# Patient Record
Sex: Male | Born: 1970 | Race: White | Hispanic: No | Marital: Married | Smoking: Never smoker
Health system: Southern US, Community
[De-identification: ages and names within clinical notes are randomized; demographics above are authoritative.]

## PROBLEM LIST (undated history)

## (undated) DIAGNOSIS — E119 Type 2 diabetes mellitus without complications: Secondary | ICD-10-CM

## (undated) DIAGNOSIS — E789 Disorder of lipoprotein metabolism, unspecified: Secondary | ICD-10-CM

---

## 2009-10-24 ENCOUNTER — Emergency Department (HOSPITAL_COMMUNITY): Admission: EM | Admit: 2009-10-24 | Discharge: 2009-10-24 | Payer: Self-pay | Admitting: Emergency Medicine

## 2009-10-29 ENCOUNTER — Emergency Department (HOSPITAL_COMMUNITY): Admission: EM | Admit: 2009-10-29 | Discharge: 2009-10-29 | Payer: Self-pay | Admitting: Emergency Medicine

## 2009-11-03 ENCOUNTER — Ambulatory Visit (HOSPITAL_COMMUNITY): Admission: RE | Admit: 2009-11-03 | Discharge: 2009-11-03 | Payer: Self-pay | Admitting: Urology

## 2010-07-09 LAB — CBC
HCT: 44.8 % (ref 39.0–52.0)
MCH: 30.7 pg (ref 26.0–34.0)
MCHC: 33.7 g/dL (ref 30.0–36.0)
RBC: 4.93 MIL/uL (ref 4.22–5.81)
RDW: 13 % (ref 11.5–15.5)
WBC: 8.8 10*3/uL (ref 4.0–10.5)

## 2010-07-09 LAB — POCT I-STAT, CHEM 8
BUN: 8 mg/dL (ref 6–23)
Calcium, Ion: 1.11 mmol/L — ABNORMAL LOW (ref 1.12–1.32)
Calcium, Ion: 1.19 mmol/L (ref 1.12–1.32)
Chloride: 106 mEq/L (ref 96–112)
Glucose, Bld: 105 mg/dL — ABNORMAL HIGH (ref 70–99)
Potassium: 3.4 mEq/L — ABNORMAL LOW (ref 3.5–5.1)
TCO2: 31 mmol/L (ref 0–100)

## 2010-07-09 LAB — URINALYSIS, ROUTINE W REFLEX MICROSCOPIC
Bilirubin Urine: NEGATIVE
Bilirubin Urine: NEGATIVE
Glucose, UA: NEGATIVE mg/dL
Glucose, UA: NEGATIVE mg/dL
Ketones, ur: 15 mg/dL — AB
Protein, ur: 30 mg/dL — AB
Specific Gravity, Urine: 1.022 (ref 1.005–1.030)
Specific Gravity, Urine: 1.025 (ref 1.005–1.030)
pH: 5.5 (ref 5.0–8.0)

## 2010-07-09 LAB — GLUCOSE, CAPILLARY
Glucose-Capillary: 110 mg/dL — ABNORMAL HIGH (ref 70–99)
Glucose-Capillary: 180 mg/dL — ABNORMAL HIGH (ref 70–99)

## 2010-07-09 LAB — URINE MICROSCOPIC-ADD ON

## 2012-04-14 ENCOUNTER — Other Ambulatory Visit: Payer: Self-pay | Admitting: Family Medicine

## 2012-04-14 DIAGNOSIS — M545 Low back pain, unspecified: Secondary | ICD-10-CM

## 2012-04-17 ENCOUNTER — Ambulatory Visit
Admission: RE | Admit: 2012-04-17 | Discharge: 2012-04-17 | Disposition: A | Discharge status: Home / Self Care | Payer: BC Managed Care – PPO | Source: Ambulatory Visit | Attending: Family Medicine | Admitting: Family Medicine

## 2012-04-17 ENCOUNTER — Ambulatory Visit: Admission: RE | Admit: 2012-04-17 | Payer: BC Managed Care – PPO | Source: Ambulatory Visit

## 2012-04-17 ENCOUNTER — Other Ambulatory Visit: Payer: Self-pay | Admitting: Family Medicine

## 2012-04-17 ENCOUNTER — Other Ambulatory Visit: Payer: Self-pay

## 2012-04-17 DIAGNOSIS — M545 Low back pain, unspecified: Secondary | ICD-10-CM

## 2012-04-17 MED ORDER — IOHEXOL 300 MG/ML  SOLN
100.0000 mL | Freq: Once | INTRAMUSCULAR | Status: AC | PRN
  Administered 2012-04-17: 100 mL via INTRAVENOUS

## 2013-03-18 IMAGING — CT CT ABD-PELV W/ CM
2 of 5 series · 16 of 46 positions shown, 18 images · IV contrast ([ID] OMNI 300)
Comparison: CT abdomen pelvis of 10/24/2009

CLINICAL DATA: Low back pain for several weeks, evaluate for
possible kidney stones

CT ABDOMEN AND PELVIS WITH CONTRAST
TECHNIQUE: Multidetector CT imaging of the abdomen and pelvis was
performed following the standard protocol during bolus
administration of intravenous contrast.
Contrast: 100mL OMNIPAQUE IOHEXOL 300 MG/ML  SOLN

[Series 2: abd/pelvis with · axial · 0.69mm/px · z∈[-388,+37]mm · 13 of 95 slices shown, 15 images]
[im 5/95  soft-tissue]
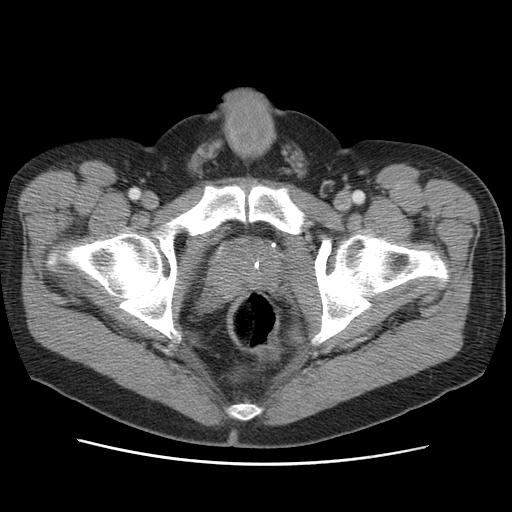
[im 5/95  bone]
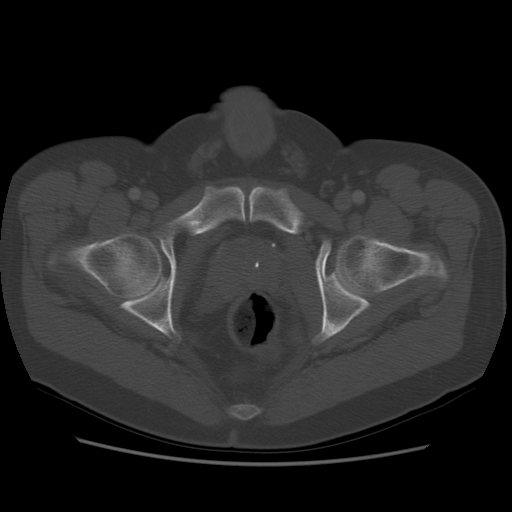
[im 15/95  soft-tissue]
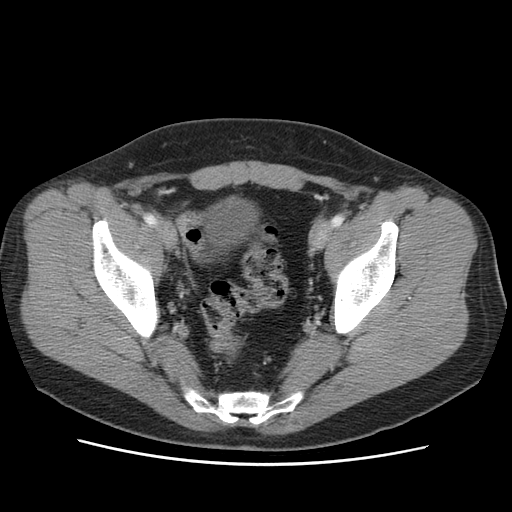
[im 20/95  soft-tissue]
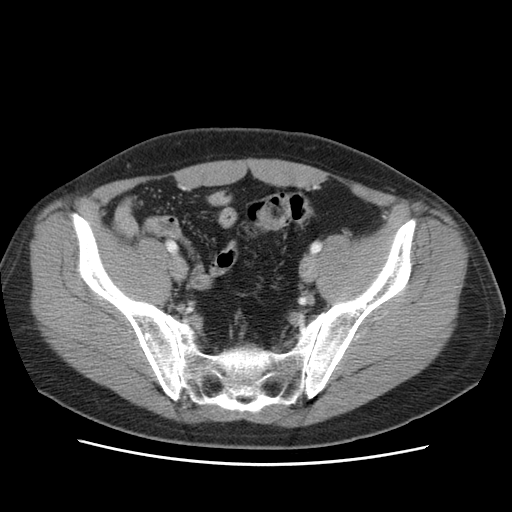
[im 25/95  soft-tissue]
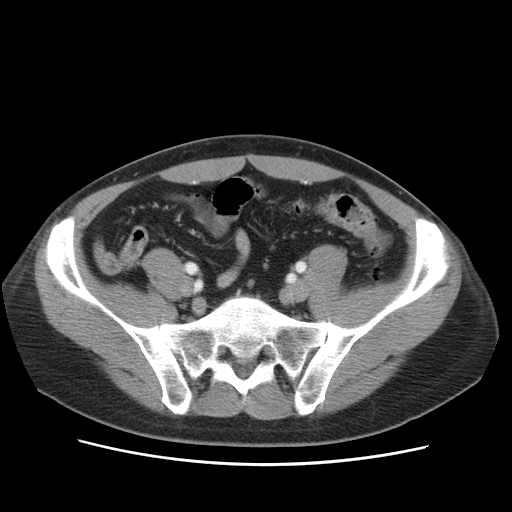
[im 35/95  soft-tissue]
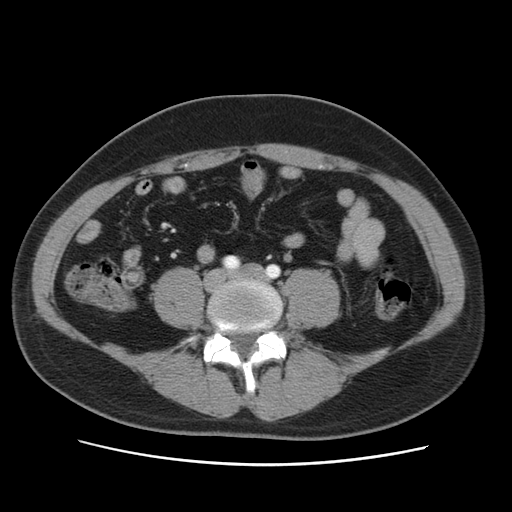
[im 40/95  soft-tissue]
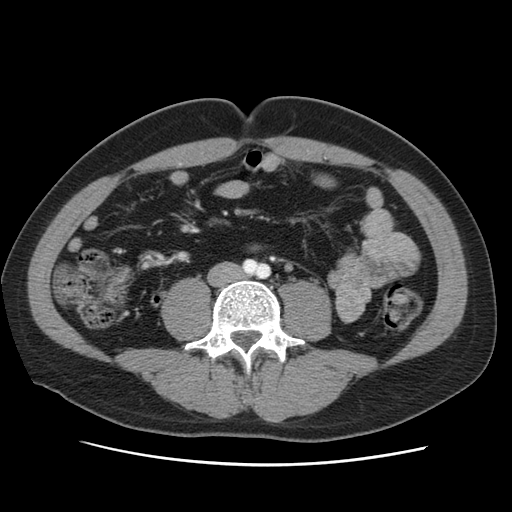
[im 50/95  soft-tissue]
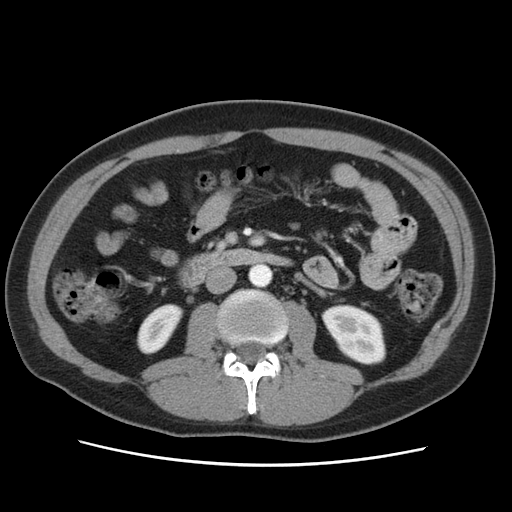
[im 55/95  soft-tissue]
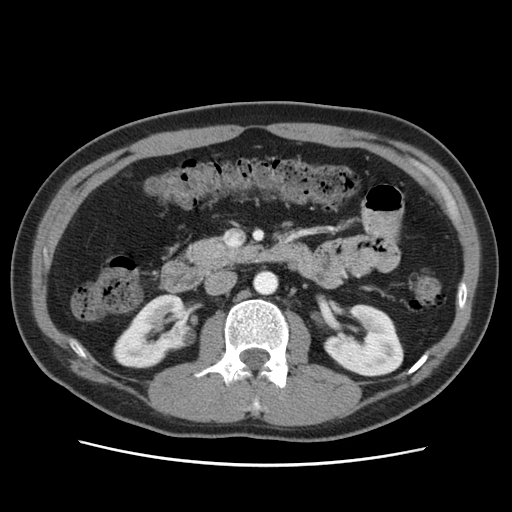
[im 60/95  soft-tissue]
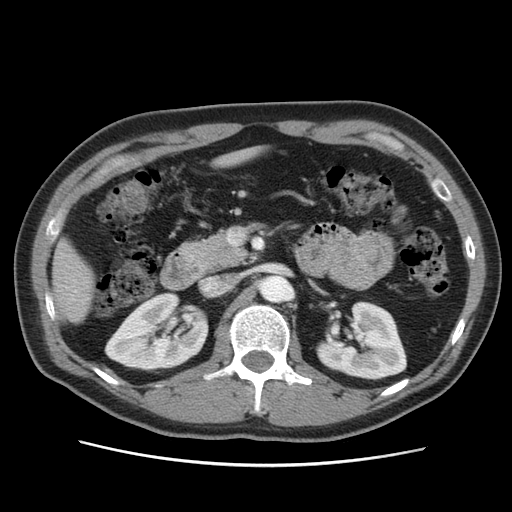
[im 60/95  bone]
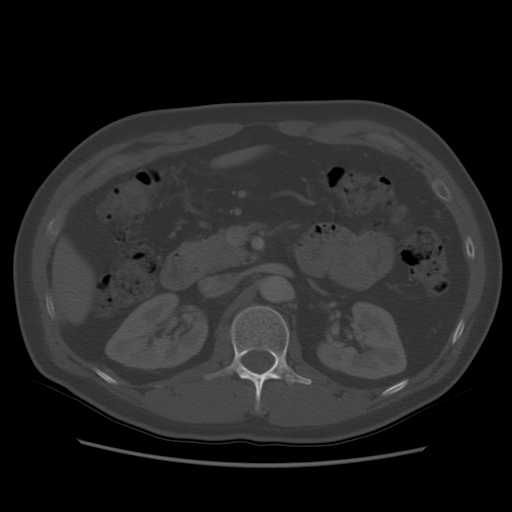
[im 70/95  soft-tissue]
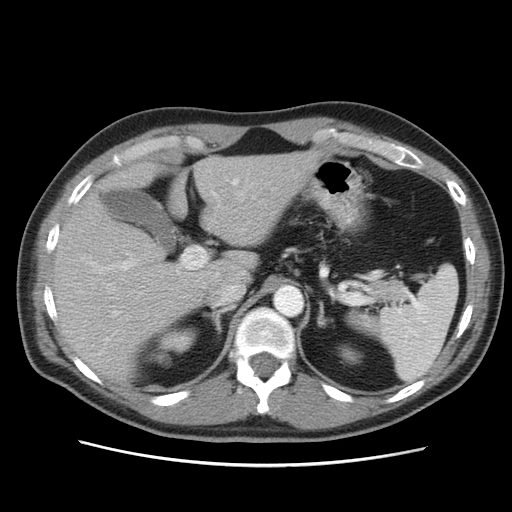
[im 75/95  soft-tissue]
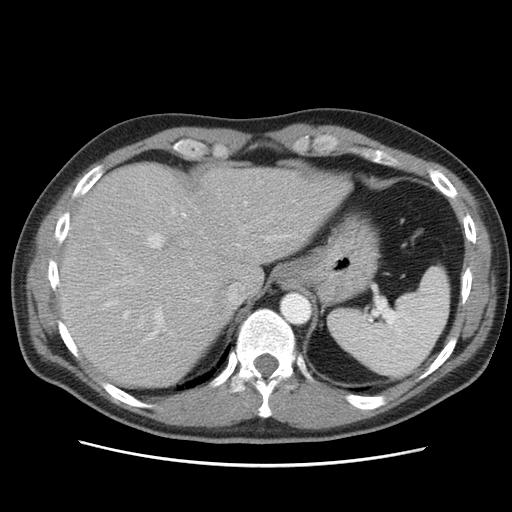
[im 80/95  soft-tissue]
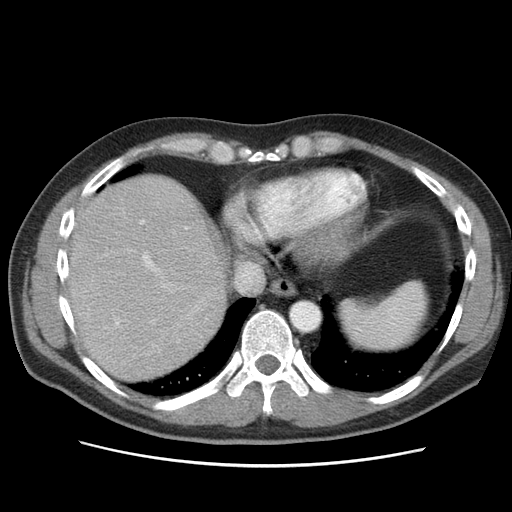
[im 90/95  soft-tissue]
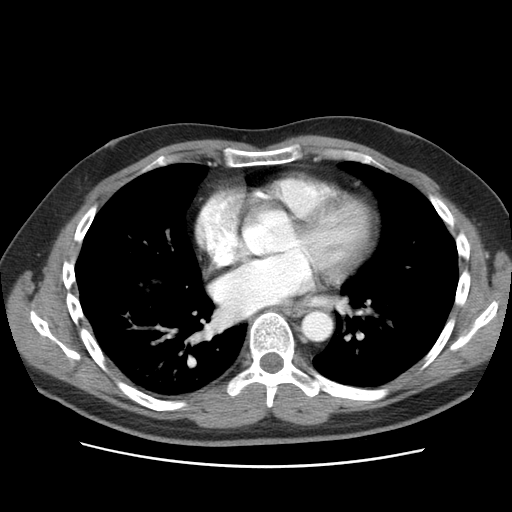

[Series 400: coronal · coronal · 1.02mm/px · 3 of 105 slices shown]
[im 35/105  soft-tissue]
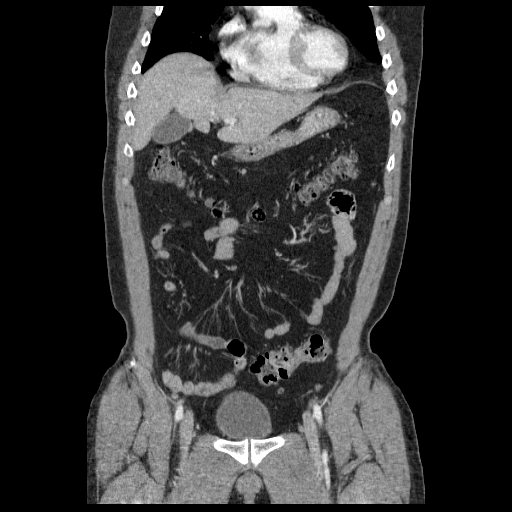
[im 47/105  soft-tissue]
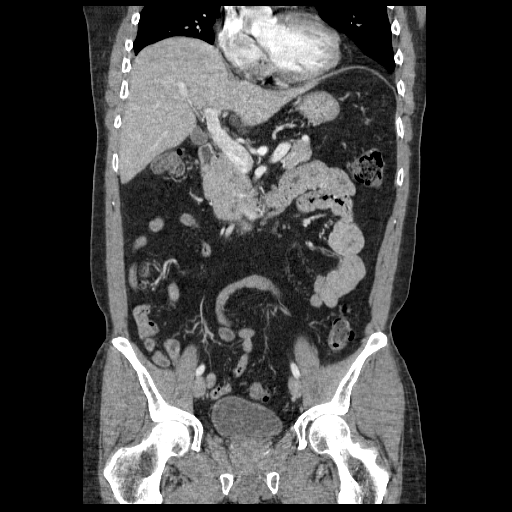
[im 58/105  soft-tissue]
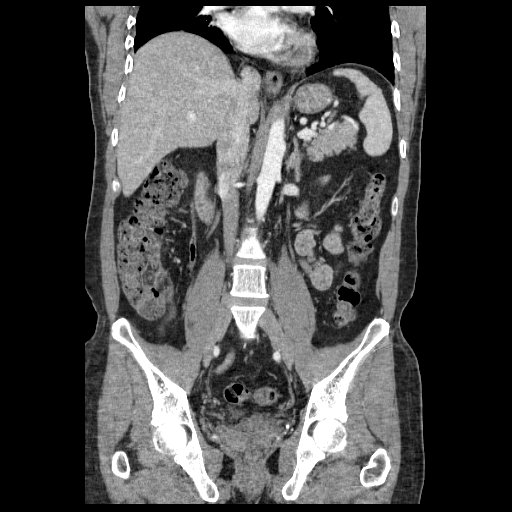

[16 of 46 positions shown; findings below may reference images not displayed]

BUN and creatinine were obtained on site at [HOSPITAL] at
[HOSPITAL]..
Results:  BUN 7 mg/dL,  Creatinine  1.0 mg/dL.
FINDINGS: The lung bases are clear.  There is mild cardiomegaly
present.  The liver enhances with no focal abnormality and no
ductal dilatation is seen.  No calcified gallstones are noted.  The
gallbladder wall may be slightly irregular.  The pancreas is normal
in size and the pancreatic duct is not dilated.  The adrenal glands
and spleen are unremarkable.  The stomach is decompressed and
cannot be evaluated.  The kidneys enhance with no calculus or mass,
with only a tiny 11 mm cyst medially in the right lower kidney. On
delayed images the pelvocaliceal systems are unremarkable.  The
ureters are normal in caliber.  The abdominal aorta appears normal
and no adenopathy is seen.

The distal ureters are normal in caliber and no distal ureteral
calculus is seen.  The urinary bladder is not optimally distended
but no abnormality is noted.  The seminal vesicles are somewhat
prominent, and the prostate is normal in size.  No fluid is seen
within the pelvis.  There are a few scattered rectosigmoid colonic
diverticula noted.  Also there are scattered diverticula throughout
the remainder of the more proximal colon.  The terminal ileum and
the appendix are unremarkable.  No abdominal wall hernia is seen.
No groin adenopathy is seen. No bony abnormality is seen.
IMPRESSION: 1.  The seminal vesicles have enlarged since the prior CT of October 2009 and appear more lobular and low in attenuation, a finding of
questionable significance.  The prostate is not enlarged although
it does contain a few prostatic calculi.
2.  No renal or ureteral calculi.  No hydronephrosis.
3.  Scattered colonic diverticula.

## 2016-11-07 DIAGNOSIS — Z79899 Other long term (current) drug therapy: Secondary | ICD-10-CM | POA: Diagnosis not present

## 2016-11-07 DIAGNOSIS — E119 Type 2 diabetes mellitus without complications: Secondary | ICD-10-CM | POA: Diagnosis not present

## 2016-11-07 DIAGNOSIS — E785 Hyperlipidemia, unspecified: Secondary | ICD-10-CM | POA: Diagnosis not present

## 2017-04-30 DIAGNOSIS — E119 Type 2 diabetes mellitus without complications: Secondary | ICD-10-CM | POA: Diagnosis not present

## 2017-04-30 DIAGNOSIS — I10 Essential (primary) hypertension: Secondary | ICD-10-CM | POA: Diagnosis not present

## 2017-04-30 DIAGNOSIS — M25522 Pain in left elbow: Secondary | ICD-10-CM | POA: Diagnosis not present

## 2017-04-30 DIAGNOSIS — E785 Hyperlipidemia, unspecified: Secondary | ICD-10-CM | POA: Diagnosis not present

## 2017-11-13 DIAGNOSIS — E785 Hyperlipidemia, unspecified: Secondary | ICD-10-CM | POA: Diagnosis not present

## 2017-11-13 DIAGNOSIS — E1165 Type 2 diabetes mellitus with hyperglycemia: Secondary | ICD-10-CM | POA: Diagnosis not present

## 2017-11-13 DIAGNOSIS — I10 Essential (primary) hypertension: Secondary | ICD-10-CM | POA: Diagnosis not present

## 2018-01-15 DIAGNOSIS — E1165 Type 2 diabetes mellitus with hyperglycemia: Secondary | ICD-10-CM | POA: Diagnosis not present

## 2018-01-15 DIAGNOSIS — I10 Essential (primary) hypertension: Secondary | ICD-10-CM | POA: Diagnosis not present

## 2018-01-15 DIAGNOSIS — E785 Hyperlipidemia, unspecified: Secondary | ICD-10-CM | POA: Diagnosis not present

## 2018-01-15 DIAGNOSIS — R05 Cough: Secondary | ICD-10-CM | POA: Diagnosis not present

## 2018-02-19 DIAGNOSIS — R040 Epistaxis: Secondary | ICD-10-CM | POA: Diagnosis not present

## 2018-03-07 DIAGNOSIS — J343 Hypertrophy of nasal turbinates: Secondary | ICD-10-CM | POA: Diagnosis not present

## 2018-03-07 DIAGNOSIS — R040 Epistaxis: Secondary | ICD-10-CM | POA: Diagnosis not present

## 2018-03-24 DIAGNOSIS — R040 Epistaxis: Secondary | ICD-10-CM | POA: Diagnosis not present

## 2021-11-08 ENCOUNTER — Telehealth (HOSPITAL_BASED_OUTPATIENT_CLINIC_OR_DEPARTMENT_OTHER): Payer: Self-pay

## 2021-11-08 ENCOUNTER — Other Ambulatory Visit (HOSPITAL_BASED_OUTPATIENT_CLINIC_OR_DEPARTMENT_OTHER): Payer: Self-pay | Admitting: Family Medicine

## 2021-11-08 DIAGNOSIS — R0789 Other chest pain: Secondary | ICD-10-CM

## 2021-11-09 ENCOUNTER — Other Ambulatory Visit: Payer: Self-pay | Admitting: Family Medicine

## 2021-11-09 ENCOUNTER — Ambulatory Visit
Admission: RE | Admit: 2021-11-09 | Discharge: 2021-11-09 | Disposition: A | Discharge status: Home / Self Care | Payer: No Typology Code available for payment source | Source: Ambulatory Visit | Attending: Family Medicine | Admitting: Family Medicine

## 2021-11-09 DIAGNOSIS — R059 Cough, unspecified: Secondary | ICD-10-CM

## 2022-10-29 ENCOUNTER — Ambulatory Visit
Admission: RE | Admit: 2022-10-29 | Discharge: 2022-10-29 | Disposition: A | Discharge status: Home / Self Care | Payer: No Typology Code available for payment source | Source: Ambulatory Visit | Attending: Internal Medicine

## 2022-10-29 ENCOUNTER — Ambulatory Visit (INDEPENDENT_AMBULATORY_CARE_PROVIDER_SITE_OTHER): Payer: No Typology Code available for payment source

## 2022-10-29 VITALS — BP 113/80 | HR 87 | Temp 98.3°F | Resp 16

## 2022-10-29 DIAGNOSIS — S52121A Displaced fracture of head of right radius, initial encounter for closed fracture: Secondary | ICD-10-CM

## 2022-10-29 DIAGNOSIS — M25521 Pain in right elbow: Secondary | ICD-10-CM

## 2022-10-29 HISTORY — DX: Disorder of lipoprotein metabolism, unspecified: E78.9

## 2022-10-29 HISTORY — DX: Type 2 diabetes mellitus without complications: E11.9

## 2022-10-29 NOTE — ED Triage Notes (Addendum)
Pt presents to UC w/ c/o right arm injury x1 week ago. Pt states he fell on it. Bruising to the inner portion of the arm. Minimal loss of ROM when lifting arm above head.

## 2022-10-29 NOTE — Discharge Instructions (Addendum)
Please keep your right arm in the splint that was applied in the clinic.  Please contact [facility] and make an appointment for this week for follow-up and further treatment.  You may still ice and rest and elevate the elbow as needed.  Continue over-the-counter ibuprofen or Tylenol as needed for pain.  Please follow-up with your PCP in 1 week for recheck.  Please go to the emergency room if you develop any worsening symptoms prior to seeing orthopedics.  This includes but is not limited to uncontrolled pain or swelling, persistent numbness or tingling, or any new concerns that arise.

## 2022-10-29 NOTE — ED Provider Notes (Addendum)
[facility]    CSN: [id number] Arrival date & time: 10/29/22  1440      History   Chief Complaint Chief Complaint  Patient presents with   Arm Injury    HPI Andre Gonzales is a 52 y.o. male presents for evaluation of an arm injury.  Patient reports 1 week ago while out of town he tripped over a branch falling onto his right arm.  No head injury or LOC.  Reports since then he has been having pain primarily to his elbow that radiates into his forearm and into his upper arm.  Does report bruising along the medial aspect of the elbow that does extend into the upper and lower arm.  States he is unable to fully extend the arm at the elbow due to pain.  No numbness or tingling.  No shoulder pain.  No neck or back pain.  No history of injuries or surgeries to the right arm/elbow in the past.  He has been taking Tylenol OTC for symptoms.  He is not on blood thinning medications.  No other concerns at this time.   Arm Injury   Past Medical History:  Diagnosis Date   Borderline high cholesterol    Diabetes (HCC)     There are no problems to display for this patient.   History reviewed. No pertinent surgical history.     Home Medications    Prior to Admission medications   Medication Sig Start Date End Date Taking? Authorizing Provider  glipiZIDE (GLUCOTROL XL) 5 MG 24 hr tablet TAKE 1 TABLET BY MOUTH EVERY DAY TWICE A DAY ORALLY 12/13/17  Yes [provider]  losartan (COZAAR) 50 MG tablet Take 1 tablet by mouth daily. 02/12/18  Yes [provider]  metFORMIN (GLUCOPHAGE) 1000 MG tablet  06/10/13  Yes [provider]  omeprazole (PRILOSEC) 20 MG capsule Take 1 tablet by mouth daily. 07/20/13  Yes [provider]  rosuvastatin (CRESTOR) 40 MG tablet Take 40 mg by mouth daily. 07/30/22  Yes [provider]    Family History History reviewed. No pertinent family history.  Social History Social History   Tobacco Use   Smoking  status: Never   Smokeless tobacco: Never     Allergies   Lisinopril   Review of Systems Review of Systems  Musculoskeletal:        Left elbow pain/injury      Physical Exam Triage Vital Signs ED Triage Vitals  Enc Vitals Group     BP 10/29/22 1450 113/80     Pulse Rate 10/29/22 1450 87     Resp 10/29/22 1450 16     Temp 10/29/22 1450 98.3 F (36.8 C)     Temp Source 10/29/22 1450 Oral     SpO2 10/29/22 1450 94 %     Weight --      Height --      Head Circumference --      Peak Flow --      Pain Score 10/29/22 1448 0     Pain Loc --      Pain Edu? --      Excl. in GC? --    No data found.  Updated Vital Signs BP 113/80 (BP Location: Right Arm)   Pulse 87   Temp 98.3 F (36.8 C) (Oral)   Resp 16   SpO2 94%   Visual Acuity Right Eye Distance:   Left Eye Distance:   Bilateral Distance:    Right  Eye Near:   Left Eye Near:    Bilateral Near:     Physical Exam Vitals and nursing note reviewed.  Constitutional:      General: He is not in acute distress.    Appearance: Normal appearance. He is not ill-appearing.  HENT:     Head: Normocephalic and atraumatic.  Eyes:     Pupils: Pupils are equal, round, and reactive to light.  Cardiovascular:     Rate and Rhythm: Normal rate.  Pulmonary:     Effort: Pulmonary effort is normal.  Musculoskeletal:     Right elbow: Decreased range of motion. Tenderness present in lateral epicondyle and olecranon process.     Comments: There is ecchymosis to the medial right elbow that extends slightly to upper and lower arm.  Unable to fully extend the elbow due to pain.  No obvious deformity.  Radial pulse +2.  Skin:    General: Skin is warm and dry.  Neurological:     General: No focal deficit present.     Mental Status: He is alert and oriented to person, place, and time.  Psychiatric:        Mood and Affect: Mood normal.        Behavior: Behavior normal.      UC Treatments / Results  Labs (all labs ordered are  listed, but only abnormal results are displayed) Labs Reviewed - No data to display  EKG   Radiology DG Elbow Complete Right  Result Date: 10/29/2022 CLINICAL DATA:  Fall 1 week ago onto the right arm. EXAM: RIGHT ELBOW - COMPLETE 3+ VIEW COMPARISON:  None Available. FINDINGS: There is mildly displaced and depressed fracture of the radial head with small elbow joint effusion. Distal humerus and ulna appear intact. IMPRESSION: Mildly displaced and depressed fracture of the radial head with small elbow joint effusion. Electronically Signed   By: Larose Hires D.O.   On: 10/29/2022 15:11    Procedures Procedures (including critical care time)  Medications Ordered in UC Medications - No data to display  Initial Impression / Assessment and Plan / UC Course  I have reviewed the triage vital signs and the nursing notes.  Pertinent labs & imaging results that were available during my care of the patient were reviewed by me and considered in my medical decision making (see chart for details).     Reviewed exam and x-ray results with patient.  X-ray shows mildly displaced radial head fracture.  Patient was placed in a fiberglass posterior short arm splint sling and advised to keep in place until seen by orthopedics.  Sensation and circulation intact after splint placement.  He will contact [facility] today to make a follow-up appointment to be seen this week.  He can still elevate and ice the elbow as needed.  He states his pain is controlled with over-the-counter analgesics which she can continue as needed.  He was instructed to go to the ER for any worsening symptoms prior to seeing orthopedics, red flags reviewed and he verbalized understanding.  PCP follow-up in 1 week for recheck. Final Clinical Impressions(s) / UC Diagnoses   Final diagnoses:  Right elbow pain  Closed displaced fracture of head of right radius, initial encounter     Discharge Instructions      Please keep your  right arm in the splint that was applied in the clinic.  Please contact [facility] and make an appointment for this week for follow-up and further treatment.  You  may still ice and rest and elevate the elbow as needed.  Continue over-the-counter ibuprofen or Tylenol as needed for pain.  Please follow-up with your PCP in 1 week for recheck.  Please go to the emergency room if you develop any worsening symptoms prior to seeing orthopedics.  This includes but is not limited to uncontrolled pain or swelling, persistent numbness or tingling, or any new concerns that arise.     ED Prescriptions   None    PDMP not reviewed this encounter.   Radford Pax, NP 10/29/22 1546    Radford Pax, NP 10/29/22 [phone]

## 2022-11-05 ENCOUNTER — Other Ambulatory Visit (INDEPENDENT_AMBULATORY_CARE_PROVIDER_SITE_OTHER): Payer: No Typology Code available for payment source

## 2022-11-05 ENCOUNTER — Encounter: Payer: Self-pay | Admitting: Surgical

## 2022-11-05 ENCOUNTER — Ambulatory Visit: Payer: No Typology Code available for payment source | Admitting: Surgical

## 2022-11-05 DIAGNOSIS — S52121A Displaced fracture of head of right radius, initial encounter for closed fracture: Secondary | ICD-10-CM | POA: Diagnosis not present

## 2022-11-05 DIAGNOSIS — M25521 Pain in right elbow: Secondary | ICD-10-CM

## 2022-11-05 NOTE — Progress Notes (Signed)
Office Visit Note   Patient: Andre Gonzales           Date of Birth: 25-Nov-1970           MRN: [id number] Visit Date: 11/05/2022 Requested by: No referring provider defined for this encounter. PCP: Pcp, No  Subjective: Chief Complaint  Patient presents with   Right Elbow - Injury    HPI: Andre Gonzales is a 52 y.o. male who presents to the office reporting right elbow pain.  Patient sustained a fall on his outstretched right arm on 10/21/2022.  Week after initial injury, he sought treatment at urgent care where he was diagnosed with radial head fracture.  They placed him in a sling and splint.  He has no history of prior injury to his right arm or prior surgery to his right arm.  He is right-hand dominant.  States that pain is significantly improved and he really has no pain at this point.  He works as Production designer, theatre/television/film at a [employer] which does involve a fair amount of lifting at times.  He also enjoys working on his house which he recently bought in [place].  Denies any mechanical symptoms in the elbow.  Denies any wrist or shoulder pain..                ROS: All systems reviewed are negative as they relate to the chief complaint within the history of present illness.  Patient denies fevers or chills.  Assessment & Plan: Visit Diagnoses:  1. Closed displaced fracture of head of right radius, initial encounter   2. Pain in right elbow     Plan: Patient is a 52 year old male who presents for evaluation of right elbow pain.  Has Mason type II radial head fracture with no mechanical block.  About 2 mm of displacement.  No ligamentous laxity.  Really no significant pain on exam except for terminal flexion.  Plan is discontinue sling and splint and range of motion as tolerated.  No lifting more than the weight of his cell phone with his right upper extremity.  Should work on extension and this was demonstrated for him in the office today.  Also recommended he take vitamin D supplement and we will see him  back in 3 weeks for clinical recheck.  Follow-Up Instructions: No follow-ups on file.   Orders:  Orders Placed This Encounter  Procedures   XR Elbow Complete Right (3+View)   No orders of the defined types were placed in this encounter.     Procedures: No procedures performed   Clinical Data: No additional findings.  Objective: Vital Signs: There were no vitals taken for this visit.  Physical Exam:  Constitutional: Patient appears well-developed HEENT:  Head: Normocephalic Eyes:EOM are normal Neck: Normal range of motion Cardiovascular: Normal rate Pulmonary/chest: Effort normal Neurologic: Patient is alert Skin: Skin is warm Psychiatric: Patient has normal mood and affect  Ortho Exam: Ortho exam demonstrates right hand with intact EPL, FPL, finger abduction.  2+ radial pulse of the right upper extremity.  Intact wrist extension and wrist flexion without weakness.  No tenderness over the anatomic snuffbox or scaphoid tubercle or diffusely throughout the right wrist.  There is a little bit of ecchymosis noted on the ulnar aspect of the right wrist.  Right elbow with flexion past 100 degrees and extension to about 5 degrees.  He has full pronation and supination without mechanical block compared with contralateral extremity.  Radial collateral ligament and ulnar collateral ligaments are  stable to varus and valgus stress.  Intact bicep flexion and tricep extension with excellent strength.  Really has no tenderness diffusely throughout any spot in the right elbow.  Minimal swelling.  Specialty Comments:  No specialty comments available.  Imaging: No results found.   PMFS History: There are no problems to display for this patient.  Past Medical History:  Diagnosis Date   Borderline high cholesterol    Diabetes (HCC)     No family history on file.  No past surgical history on file. Social History   Occupational History   Not on file  Tobacco Use   Smoking status:  Never   Smokeless tobacco: Never  Substance and Sexual Activity   Alcohol use: Not on file   Drug use: Not on file   Sexual activity: Not on file

## 2022-11-26 ENCOUNTER — Ambulatory Visit (INDEPENDENT_AMBULATORY_CARE_PROVIDER_SITE_OTHER): Payer: No Typology Code available for payment source | Admitting: Surgical

## 2022-11-26 ENCOUNTER — Other Ambulatory Visit (INDEPENDENT_AMBULATORY_CARE_PROVIDER_SITE_OTHER): Payer: No Typology Code available for payment source

## 2022-11-26 ENCOUNTER — Encounter: Payer: Self-pay | Admitting: Surgical

## 2022-11-26 DIAGNOSIS — S52121A Displaced fracture of head of right radius, initial encounter for closed fracture: Secondary | ICD-10-CM

## 2022-11-26 NOTE — Progress Notes (Addendum)
   Post-fracture visit Note   Patient: Andre Gonzales           Date of Birth: 1971/01/20           MRN: [id number] Visit Date: 11/26/2022 PCP: Pcp, No   Assessment & Plan:  Chief Complaint:  Chief Complaint  Patient presents with   Right Elbow - Follow-up    DOI: 10/21/22   Visit Diagnoses:  1. Closed displaced fracture of head of right radius, initial encounter     Plan: Patient is a 52 year old male who presents for reevaluation of right radial head fracture.  States he is about 20% better since his last visit.  He has some radiation of pain from the elbow down to the wrist this time.  Primarily worse with pronation of the forearm.  Pain does not wake him up from sleep at night.  No mechanical symptoms in the elbow.  He works at [employer] as a Production designer, theatre/television/film.  He does not smoke or vape.  Taking vitamin D supplements.  Date of injury was 10/21/2022.  No significant medical history aside from diabetes which he states is well-controlled.  He is going for A1c check in the next several days.  On exam, patient has intact EPL, FPL, finger abduction, grip strength, wrist extension rated 5/5.  Intact pronation and supination strength rated 5/5.  He has no restriction of pronation or supination compared to contralateral forearm and there is no mechanical crepitus noted during the motions.  He hasgood flexion greater than 100 degrees with minimal pain just at the end of range of motion.  Does have reduced tenderness over the radial head compared with prior exam.  He lacks about 8 degrees of extension.  Radiographs taken today demonstrate minimal displacement of the radial head fracture compared with prior radiographs.  No significant callus formation noted as of yet.  Patient states that he has been lifting heavy totes at work; I discouraged him from any further lifting more than about the weight of his cell phone with this arm as we discussed at his last visit.  Also discussed radiographs with Dr. Denese Killings who  advised against any surgical intervention due to lack of mechanical block to pronation/supination. Has flexion contracture so need to get Leonette Most working with physical therapy.  He will start physical therapy upstairs primarily focusing on extension and follow-up in 3 weeks for clinical recheck.  Patient is agreeable to plan.  Follow-Up Instructions: No follow-ups on file.   Orders:  Orders Placed This Encounter  Procedures   XR Elbow Complete Right (3+View)   Ambulatory referral to Occupational Therapy   No orders of the defined types were placed in this encounter.   Imaging: No results found.  PMFS History: There are no problems to display for this patient.  Past Medical History:  Diagnosis Date   Borderline high cholesterol    Diabetes (HCC)     No family history on file.  No past surgical history on file. Social History   Occupational History   Not on file  Tobacco Use   Smoking status: Never   Smokeless tobacco: Never  Substance and Sexual Activity   Alcohol use: Not on file   Drug use: Not on file   Sexual activity: Not on file

## 2022-12-17 ENCOUNTER — Ambulatory Visit: Payer: No Typology Code available for payment source | Admitting: Surgical
